# Patient Record
Sex: Female | Born: 1970 | Hispanic: Yes | Marital: Married | Smoking: Never smoker
Health system: Southern US, Community
[De-identification: ages and names within clinical notes are randomized; demographics above are authoritative.]

---

## 2006-04-28 ENCOUNTER — Ambulatory Visit: Payer: Self-pay | Admitting: Family Medicine

## 2006-09-26 ENCOUNTER — Inpatient Hospital Stay: Payer: Self-pay | Admitting: Obstetrics and Gynecology

## 2006-10-11 ENCOUNTER — Emergency Department: Payer: Self-pay | Admitting: Emergency Medicine

## 2010-01-23 ENCOUNTER — Ambulatory Visit: Payer: Self-pay | Admitting: Family Medicine

## 2010-02-13 ENCOUNTER — Ambulatory Visit: Payer: Self-pay | Admitting: Surgery

## 2010-07-03 ENCOUNTER — Ambulatory Visit: Payer: Self-pay | Admitting: Surgery

## 2010-07-17 ENCOUNTER — Ambulatory Visit: Payer: Self-pay | Admitting: Surgery

## 2010-07-23 ENCOUNTER — Ambulatory Visit: Payer: Self-pay | Admitting: Surgery

## 2010-07-29 LAB — PATHOLOGY REPORT

## 2019-11-19 ENCOUNTER — Ambulatory Visit: Payer: Self-pay | Attending: Internal Medicine

## 2019-11-19 DIAGNOSIS — Z23 Encounter for immunization: Secondary | ICD-10-CM

## 2019-11-19 NOTE — Progress Notes (Signed)
   Covid-19 Vaccination Clinic  Name:  Lotus Santillo    MRN: [id number] DOB: 1971-02-28  11/19/2019  Ms. Tzintzun was observed post Covid-19 immunization for 15 minutes without incident. She was provided with Vaccine Information Sheet and instruction to access the V-Safe system.   Ms. Carolyne Fiscal was instructed to call 911 with any severe reactions post vaccine: Marland Kitchen Difficulty breathing  . Swelling of face and throat  . A fast heartbeat  . A bad rash all over body  . Dizziness and weakness   Immunizations Administered    Name Date Dose VIS Date Route   Pfizer COVID-19 Vaccine 11/19/2019 11:09 AM 0.3 mL 07/27/2018 Intramuscular   Manufacturer: [employer], [employer]   Lot: [id number]   NDC: [address]-3

## 2019-12-10 ENCOUNTER — Other Ambulatory Visit: Payer: Self-pay

## 2019-12-10 ENCOUNTER — Ambulatory Visit: Payer: Self-pay | Attending: Internal Medicine

## 2019-12-10 DIAGNOSIS — Z23 Encounter for immunization: Secondary | ICD-10-CM

## 2019-12-10 NOTE — Progress Notes (Signed)
   Covid-19 Vaccination Clinic  Name:  Sydney Hutchinson    MRN: [id number] DOB: 04/28/1971  12/10/2019  Ms. Sydney Hutchinson was observed post Covid-19 immunization for 15 minutes without incident. She was provided with Vaccine Information Sheet and instruction to access the V-Safe system.   Ms. Sydney Hutchinson was instructed to call 911 with any severe reactions post vaccine: Marland Kitchen Difficulty breathing  . Swelling of face and throat  . A fast heartbeat  . A bad rash all over body  . Dizziness and weakness   Immunizations Administered    Name Date Dose VIS Date Route   Pfizer COVID-19 Vaccine 12/10/2019 10:25 AM 0.3 mL 07/27/2018 Intramuscular   Manufacturer: [employer], [employer]   Lot: [id number]   NDC: [address]-2

## 2020-01-16 ENCOUNTER — Other Ambulatory Visit: Payer: Self-pay | Admitting: Family Medicine

## 2020-01-16 DIAGNOSIS — Z1231 Encounter for screening mammogram for malignant neoplasm of breast: Secondary | ICD-10-CM

## 2020-05-09 ENCOUNTER — Emergency Department: Payer: PRIVATE HEALTH INSURANCE

## 2020-05-09 ENCOUNTER — Encounter: Payer: Self-pay | Admitting: Intensive Care

## 2020-05-09 ENCOUNTER — Other Ambulatory Visit: Payer: Self-pay

## 2020-05-09 ENCOUNTER — Emergency Department
Admission: EM | Admit: 2020-05-09 | Discharge: 2020-05-09 | Disposition: A | Discharge status: Home / Self Care | Payer: PRIVATE HEALTH INSURANCE | Attending: Emergency Medicine | Admitting: Emergency Medicine

## 2020-05-09 DIAGNOSIS — W19XXXA Unspecified fall, initial encounter: Secondary | ICD-10-CM | POA: Insufficient documentation

## 2020-05-09 DIAGNOSIS — M545 Low back pain, unspecified: Secondary | ICD-10-CM | POA: Diagnosis not present

## 2020-05-09 MED ORDER — CYCLOBENZAPRINE HCL 10 MG PO TABS: 10.0000 mg | ORAL_TABLET | Freq: Three times a day (TID) | ORAL | 0 refills | Status: AC | PRN

## 2020-05-09 MED ORDER — TRAMADOL HCL 50 MG PO TABS
50.0000 mg | ORAL_TABLET | Freq: Four times a day (QID) | ORAL | 0 refills | Status: AC | PRN
Start: 2020-05-09 — End: ?

## 2020-05-09 MED ORDER — ORPHENADRINE CITRATE 30 MG/ML IJ SOLN
60.0000 mg | Freq: Two times a day (BID) | INTRAMUSCULAR | Status: DC
  Administered 2020-05-09: 60 mg via INTRAMUSCULAR
  Filled 2020-05-09: qty 2

## 2020-05-09 MED ORDER — HYDROMORPHONE HCL 1 MG/ML IJ SOLN
1.0000 mg | Freq: Once | INTRAMUSCULAR | Status: AC
  Administered 2020-05-09: 1 mg via INTRAMUSCULAR
  Filled 2020-05-09: qty 1

## 2020-05-09 MED ORDER — IBUPROFEN 600 MG PO TABS: 600.0000 mg | ORAL_TABLET | Freq: Four times a day (QID) | ORAL | 0 refills | Status: AC | PRN

## 2020-05-09 NOTE — ED Provider Notes (Signed)
[facility] Emergency Department Provider Note   ____________________________________________   First MD Initiated Contact with Patient 05/09/20 [phone]     (approximate)  I have reviewed the triage vital signs and the nursing notes.   HISTORY  Chief Complaint Back Pain    HPI Via interpreter Kyla Duffy is a 49 y.o. female patient complain of low back pain secondary to a fall 5 days ago.  Patient denies radicular component to her back pain.  Patient denies bladder or bowel dysfunction.  Patient state pain with ambulation.  Rates pain as a 10/10.  Described pain as "achy".  No relief with over-the-counter Tylenol.         History reviewed. No pertinent past medical history.  There are no problems to display for this patient.   History reviewed. No pertinent surgical history.  Prior to Admission medications   Medication Sig Start Date End Date Taking? Authorizing Provider  cyclobenzaprine (FLEXERIL) 10 MG tablet Take 1 tablet (10 mg total) by mouth 3 (three) times daily as needed. 05/09/20   Joni Reining, PA-C  ibuprofen (ADVIL) 600 MG tablet Take 1 tablet (600 mg total) by mouth every 6 (six) hours as needed. 05/09/20   Joni Reining, PA-C  traMADol (ULTRAM) 50 MG tablet Take 1 tablet (50 mg total) by mouth every 6 (six) hours as needed for moderate pain. 05/09/20   Joni Reining, PA-C    Allergies Patient has no known allergies.  History reviewed. No pertinent family history.  Social History Social History   Tobacco Use  . Smoking status: Never Smoker  . Smokeless tobacco: Never Used  Substance Use Topics  . Alcohol use: Never  . Drug use: Never    Review of Systems Constitutional: No fever/chills Eyes: No visual changes. ENT: No sore throat. Cardiovascular: Denies chest pain. Respiratory: Denies shortness of breath. Gastrointestinal: No abdominal pain.  No nausea, no vomiting.  No diarrhea.  No  constipation. Genitourinary: Negative for dysuria. Musculoskeletal: Positive for back pain. Skin: Negative for rash. Neurological: Negative for headaches, focal weakness or numbness.   ____________________________________________   PHYSICAL EXAM:  VITAL SIGNS: ED Triage Vitals  Enc Vitals Group     BP 05/09/20 0715 (!) 141/95     Pulse Rate 05/09/20 0715 (!) 57     Resp 05/09/20 0715 16     Temp 05/09/20 0715 97.7 F (36.5 C)     Temp Source 05/09/20 0715 Oral     SpO2 05/09/20 0715 98 %     Weight 05/09/20 0724 175 lb (79.4 kg)     Height --      Head Circumference --      Peak Flow --      Pain Score 05/09/20 0724 10     Pain Loc --      Pain Edu? --      Excl. in GC? --     Constitutional: Alert and oriented. Well appearing and in no acute distress. Cardiovascular: Bradycardic, regular rhythm. Grossly normal heart sounds.  Good peripheral circulation. Respiratory: Normal respiratory effort.  No retractions. Lungs CTAB. Gastrointestinal: Soft and nontender. No distention. No abdominal bruits. No CVA tenderness. Genitourinary: Deferred Musculoskeletal: No obvious deformity to the lumbar spine.  Patient is moderate guarding palpation of L4-S1.  Patient has full neck range of motion of the lower extremities.  Patient has bilateral negative straight leg test. Neurologic:  Normal speech and language. No gross focal neurologic deficits are appreciated. No  gait instability. Skin:  Skin is warm, dry and intact. No rash noted.  No abrasion or ecchymosis. Psychiatric: Mood and affect are normal. Speech and behavior are normal.  ____________________________________________   LABS (all labs ordered are listed, but only abnormal results are displayed)  Labs Reviewed - No data to display ____________________________________________  EKG   ____________________________________________  RADIOLOGY I, Joni Reining, personally viewed and evaluated these images (plain  radiographs) as part of my medical decision making, as well as reviewing the written report by the radiologist.  ED MD interpretation: Multilevel DJD without acute findings.  Official radiology report(s): DG Lumbar Spine Complete  Result Date: 05/09/2020 CLINICAL DATA:  Low back pain after fall several days ago. EXAM: LUMBAR SPINE - COMPLETE 4+ VIEW COMPARISON:  None. FINDINGS: No fracture or spondylolisthesis is noted. Mild degenerative disc disease is noted at L3-4 and L4-5 with anterior osteophyte formation. IMPRESSION: Mild multilevel degenerative disc disease. No acute abnormality is noted. Electronically Signed   By: Lupita Raider M.D.   On: 05/09/2020 08:44    ____________________________________________   PROCEDURES  Procedure(s) performed (including Critical Care):  Procedures   ____________________________________________   INITIAL IMPRESSION / ASSESSMENT AND PLAN / ED COURSE  As part of my medical decision making, I reviewed the following data within the electronic MEDICAL RECORD NUMBER         Patient presents with low back pain secondary to fall.  Discussed x-ray findings with patient.  Patient splinted well to IM injection of Dilaudid and Norflex.  Patient given discharge care instruction and advised take medication as directed.  Patient given a work note for 2 days.  Patient advised follow-up with PCP.      ____________________________________________   FINAL CLINICAL IMPRESSION(S) / ED DIAGNOSES  Final diagnoses:  Acute bilateral low back pain without sciatica     ED Discharge Orders         Ordered    cyclobenzaprine (FLEXERIL) 10 MG tablet  3 times daily PRN        05/09/20 0856    traMADol (ULTRAM) 50 MG tablet  Every 6 hours PRN        05/09/20 0856    ibuprofen (ADVIL) 600 MG tablet  Every 6 hours PRN        05/09/20 0856          *Please note:  Sydney Hutchinson was evaluated in Emergency Department on 05/09/2020 for the symptoms  described in the history of present illness. She was evaluated in the context of the global COVID-19 pandemic, which necessitated consideration that the patient might be at risk for infection with the SARS-CoV-2 virus that causes COVID-19. Institutional protocols and algorithms that pertain to the evaluation of patients at risk for COVID-19 are in a state of rapid change based on information released by regulatory bodies including the CDC and federal and state organizations. These policies and algorithms were followed during the patient's care in the ED.  Some ED evaluations and interventions may be delayed as a result of limited staffing during and the pandemic.*   Note:  This document was prepared using Dragon voice recognition software and may include unintentional dictation errors.    Joni Reining, PA-C 05/09/20 0900    Merwyn Katos, MD 05/09/20 [phone]

## 2020-05-09 NOTE — ED Triage Notes (Signed)
Interpretor on a stick used for triage. Patient c/o back pain since Friday. Denies urinary symptoms or injury

## 2020-05-09 NOTE — Discharge Instructions (Signed)
Follow discharge care instruction take medication as directed. °

## 2021-10-12 IMAGING — CR DG LUMBAR SPINE COMPLETE 4+V
5 series · 5 of 5 positions shown · non-contrast
Comparison: None.

CLINICAL DATA: Low back pain after fall several days ago.

EXAM:
LUMBAR SPINE - COMPLETE 4+ VIEW

[l-spine ap]
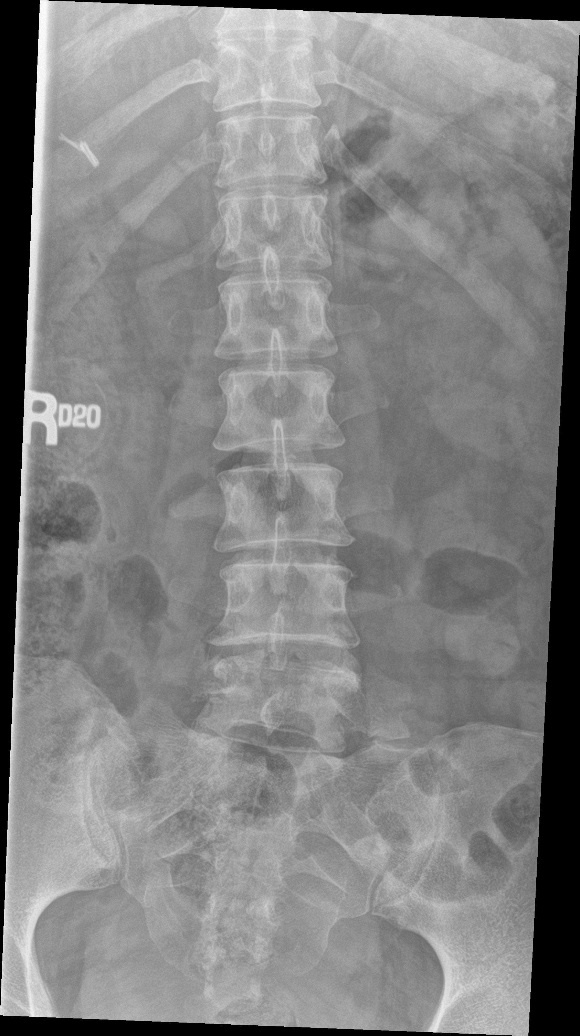

[l-spine obl (1 of 2)]
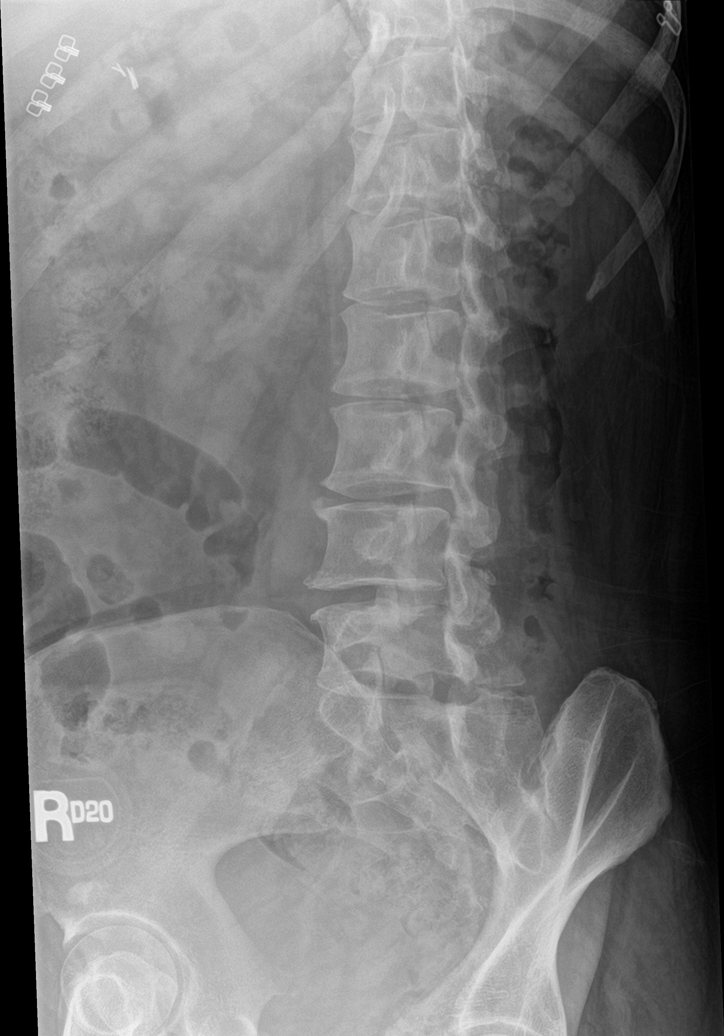

[l-spine obl (2 of 2)]
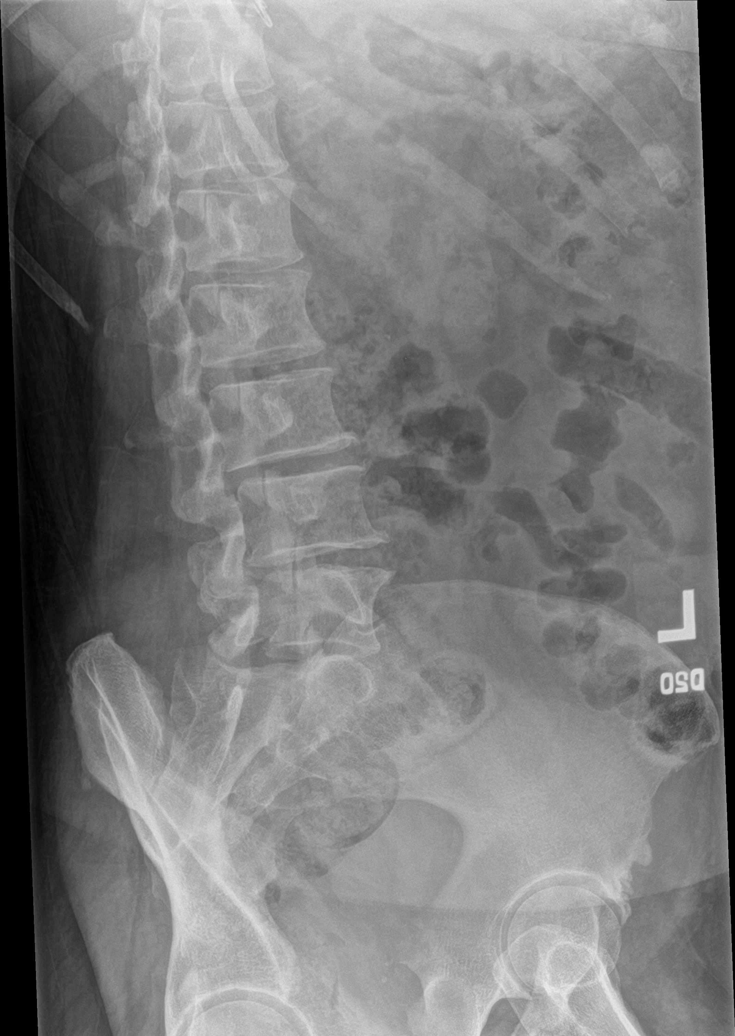

[l-spine lat]
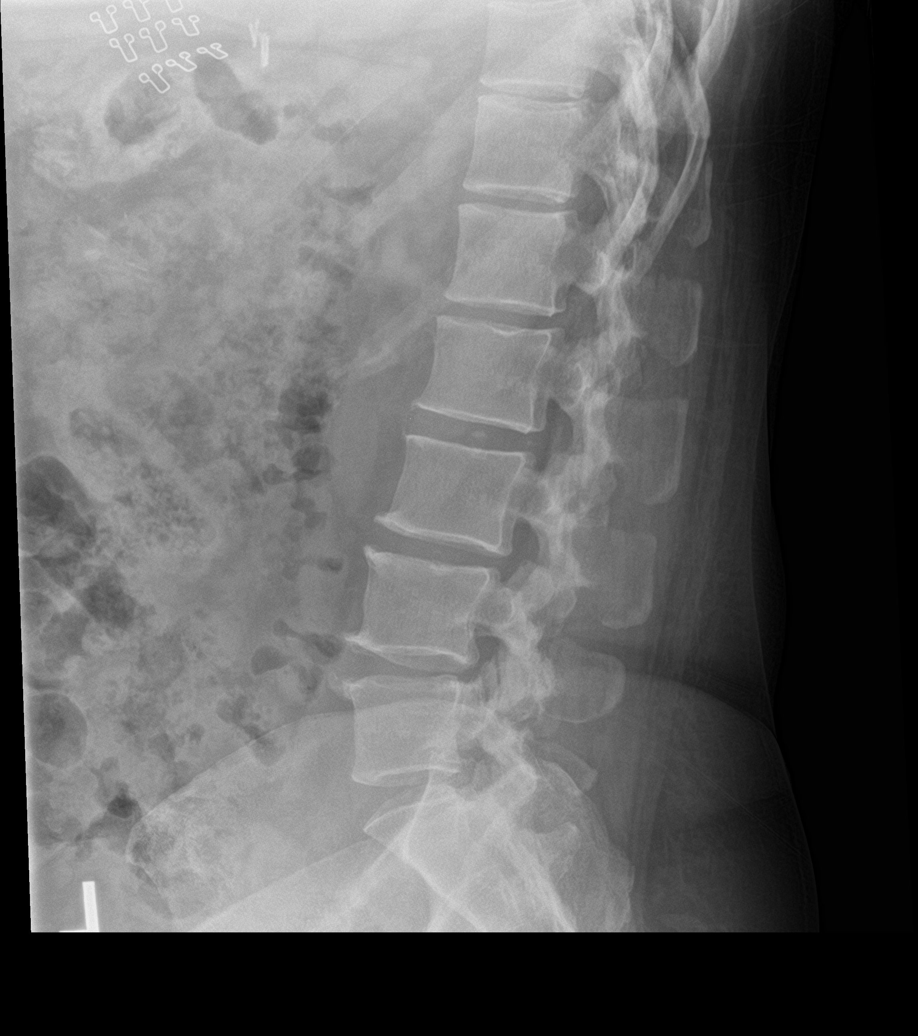

[l-spine spot]
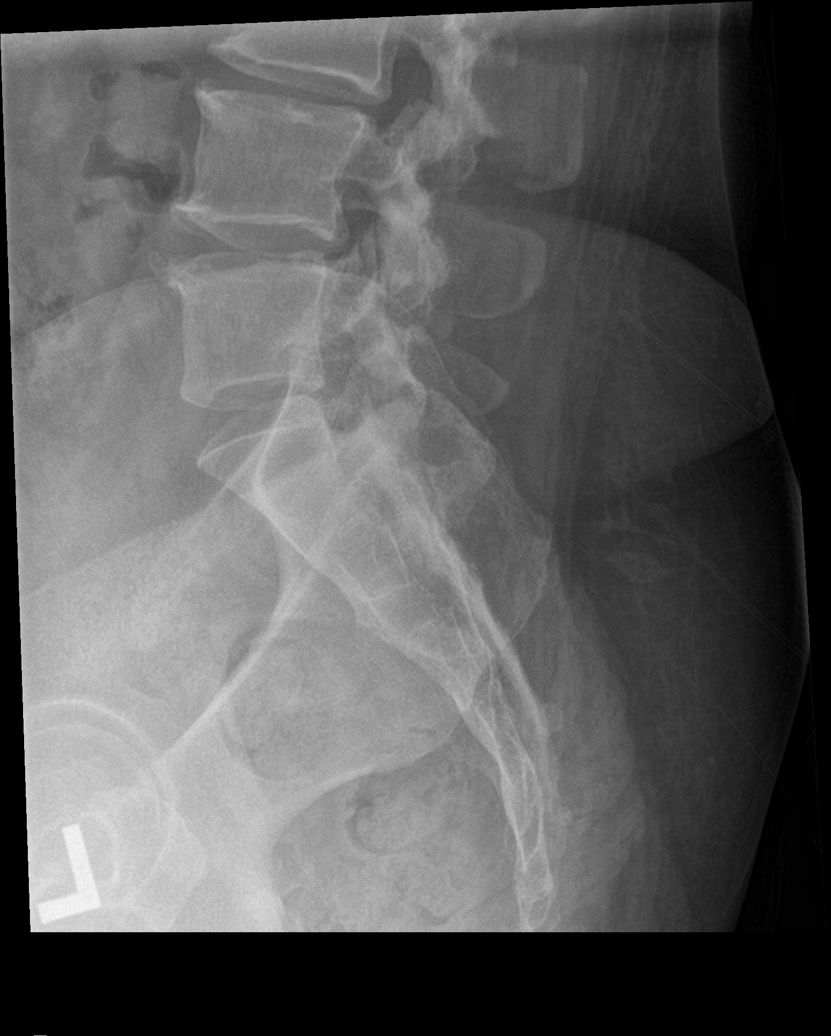

[5 of 5 positions shown; findings below may reference images not displayed]

FINDINGS: No fracture or spondylolisthesis is noted. Mild degenerative disc
disease is noted at L3-4 and L4-5 with anterior osteophyte
formation.
IMPRESSION: Mild multilevel degenerative disc disease. No acute abnormality is
noted.

## 2022-01-24 ENCOUNTER — Ambulatory Visit: Payer: No Typology Code available for payment source | Admitting: Podiatry

## 2022-01-24 ENCOUNTER — Encounter: Payer: Self-pay | Admitting: Podiatry

## 2022-01-24 DIAGNOSIS — B351 Tinea unguium: Secondary | ICD-10-CM

## 2022-01-24 NOTE — Progress Notes (Signed)
   Chief Complaint  Patient presents with   Nail Problem    NP     bilateral  toenail fungus, big toes x 1 year, thick discolored    Subjective: 51 y.o. female presenting today as a new patient with her son for evaluation of discoloration to the bilateral great toenails that has been present for about 1 year now.  She has not done anything for treatment.  She presents for further treatment and evaluation  History reviewed. No pertinent past medical history.  History reviewed. No pertinent surgical history.  No Known Allergies  Objective: Physical Exam General: The patient is alert and oriented x3 in no acute distress.  Dermatology: Hyperkeratotic, discolored, thickened, onychodystrophy noted bilateral great toes. Skin is warm, dry and supple bilateral lower extremities. Negative for open lesions or macerations.  Vascular: Palpable pedal pulses bilaterally. No edema or erythema noted. Capillary refill within normal limits.  Neurological: Epicritic and protective threshold grossly intact bilaterally.   Musculoskeletal Exam: No pedal deformity noted  Assessment: #1 Onychomycosis of toenails  Plan of Care:  #1 Patient was evaluated. #2  Today we discussed different treatment options including oral, topical, and laser antifungal treatment modalities.  We discussed their efficacies and side effects.  Patient opts for topical antifungal.  #3 OTC Tolcylen antifungal dispensed at checkout.  Apply daily #4 return to clinic as needed   Felecia Shelling, DPM [facility]  Dr. Felecia Shelling, DPM    [address] [place], [place] [address]                Office [phone]  Fax [phone]

## 2023-03-30 ENCOUNTER — Other Ambulatory Visit: Payer: Self-pay | Admitting: Obstetrics and Gynecology

## 2023-03-30 DIAGNOSIS — Z1231 Encounter for screening mammogram for malignant neoplasm of breast: Secondary | ICD-10-CM

## 2023-04-13 ENCOUNTER — Encounter: Payer: Self-pay | Admitting: *Deleted

## 2023-08-26 ENCOUNTER — Telehealth: Payer: Self-pay

## 2023-08-26 NOTE — Telephone Encounter (Signed)
 Called patient and she stated that she is Sydney Hutchinson then it got quiet then I got hung up on again.

## 2023-08-26 NOTE — Telephone Encounter (Signed)
 The patient called in to schedule her colonoscopy.

## 2023-08-26 NOTE — Telephone Encounter (Signed)
 Tried to call patient but she hung up. Will try again later.

## 2023-09-23 ENCOUNTER — Other Ambulatory Visit: Payer: Self-pay | Admitting: *Deleted

## 2023-09-23 ENCOUNTER — Telehealth: Payer: Self-pay | Admitting: *Deleted

## 2023-09-23 DIAGNOSIS — Z1211 Encounter for screening for malignant neoplasm of colon: Secondary | ICD-10-CM

## 2023-09-23 MED ORDER — NA SULFATE-K SULFATE-MG SULF 17.5-3.13-1.6 GM/177ML PO SOLN: 1.0000 | Freq: Once | ORAL | 0 refills | Status: AC

## 2023-09-23 NOTE — Telephone Encounter (Signed)
 Gastroenterology Pre-Procedure Review  Request Date: 10/15/2023 Requesting Physician: Dr. Ole Berkeley  PATIENT REVIEW QUESTIONS: The patient responded to the following health history questions as indicated:    1. Are you having any GI issues? no 2. Do you have a personal history of Polyps? no 3. Do you have a family history of Colon Cancer or Polyps? no 4. Diabetes Mellitus? no 5. Joint replacements in the past 12 months?no 6. Major health problems in the past 3 months?no 7. Any artificial heart valves, MVP, or defibrillator?no    MEDICATIONS & ALLERGIES:    Patient reports the following regarding taking any anticoagulation/antiplatelet therapy:   Plavix, Coumadin, Eliquis, Xarelto, Lovenox, Pradaxa, Brilinta, or Effient? no Aspirin? no  Patient confirms/reports the following medications:  Current Outpatient Medications  Medication Sig Dispense Refill   cetirizine (ZYRTEC) 10 MG tablet Take 10 mg by mouth daily as needed.     cyclobenzaprine  (FLEXERIL ) 10 MG tablet Take 1 tablet (10 mg total) by mouth 3 (three) times daily as needed. 15 tablet 0   ibuprofen  (ADVIL ) 600 MG tablet Take 1 tablet (600 mg total) by mouth every 6 (six) hours as needed. 30 tablet 0   traMADol  (ULTRAM ) 50 MG tablet Take 1 tablet (50 mg total) by mouth every 6 (six) hours as needed for moderate pain. 12 tablet 0   No current facility-administered medications for this visit.    Patient confirms/reports the following allergies:  No Known Allergies  No orders of the defined types were placed in this encounter.   AUTHORIZATION INFORMATION Primary Insurance: 1D#: Group #:  Secondary Insurance: 1D#: Group #:  SCHEDULE INFORMATION: Date: 10/15/2023 Time: Location:  [facility]

## 2023-09-23 NOTE — Telephone Encounter (Signed)
 Updated patient's phone number from the scan referral

## 2023-10-15 ENCOUNTER — Ambulatory Visit: Admission: RE | Admit: 2023-10-15 | Source: Home / Self Care | Admitting: Gastroenterology

## 2023-10-15 SURGERY — COLONOSCOPY
Anesthesia: General
